# Patient Record
Sex: Female | Born: 1960
Health system: Southern US, Community
[De-identification: ages and names within clinical notes are randomized; demographics above are authoritative.]

## PROBLEM LIST (undated history)

## (undated) DIAGNOSIS — I10 Essential (primary) hypertension: Secondary | ICD-10-CM

## (undated) HISTORY — PX: ABDOMINAL HYSTERECTOMY: SHX81

---

## 1999-02-10 ENCOUNTER — Other Ambulatory Visit: Admission: RE | Admit: 1999-02-10 | Discharge: 1999-02-10 | Payer: Self-pay | Admitting: Gynecology

## 1999-05-22 ENCOUNTER — Other Ambulatory Visit: Admission: RE | Admit: 1999-05-22 | Discharge: 1999-05-22 | Payer: Self-pay | Admitting: Obstetrics & Gynecology

## 1999-12-11 ENCOUNTER — Inpatient Hospital Stay (HOSPITAL_COMMUNITY): Admission: AD | Admit: 1999-12-11 | Discharge: 1999-12-14 | Payer: Self-pay | Admitting: Obstetrics & Gynecology

## 1999-12-15 ENCOUNTER — Encounter: Admission: RE | Admit: 1999-12-15 | Discharge: 2000-01-18 | Payer: Self-pay | Admitting: Obstetrics & Gynecology

## 1999-12-18 ENCOUNTER — Inpatient Hospital Stay (HOSPITAL_COMMUNITY): Admission: AD | Admit: 1999-12-18 | Discharge: 1999-12-18 | Payer: Self-pay | Admitting: *Deleted

## 2000-01-21 ENCOUNTER — Other Ambulatory Visit: Admission: RE | Admit: 2000-01-21 | Discharge: 2000-01-21 | Payer: Self-pay | Admitting: Obstetrics & Gynecology

## 2001-04-18 ENCOUNTER — Other Ambulatory Visit: Admission: RE | Admit: 2001-04-18 | Discharge: 2001-04-18 | Payer: Self-pay | Admitting: Obstetrics & Gynecology

## 2002-12-12 ENCOUNTER — Other Ambulatory Visit: Admission: RE | Admit: 2002-12-12 | Discharge: 2002-12-12 | Payer: Self-pay | Admitting: Obstetrics & Gynecology

## 2005-04-12 ENCOUNTER — Other Ambulatory Visit: Admission: RE | Admit: 2005-04-12 | Discharge: 2005-04-12 | Payer: Self-pay | Admitting: Obstetrics & Gynecology

## 2019-12-19 ENCOUNTER — Emergency Department (HOSPITAL_BASED_OUTPATIENT_CLINIC_OR_DEPARTMENT_OTHER): Payer: 59

## 2019-12-19 ENCOUNTER — Encounter (HOSPITAL_BASED_OUTPATIENT_CLINIC_OR_DEPARTMENT_OTHER): Payer: Self-pay

## 2019-12-19 ENCOUNTER — Emergency Department (HOSPITAL_BASED_OUTPATIENT_CLINIC_OR_DEPARTMENT_OTHER)
Admission: EM | Admit: 2019-12-19 | Discharge: 2019-12-19 | Disposition: A | Discharge status: Home / Self Care | Payer: 59 | Attending: Emergency Medicine | Admitting: Emergency Medicine

## 2019-12-19 ENCOUNTER — Other Ambulatory Visit: Payer: Self-pay

## 2019-12-19 DIAGNOSIS — I10 Essential (primary) hypertension: Secondary | ICD-10-CM | POA: Insufficient documentation

## 2019-12-19 DIAGNOSIS — M25512 Pain in left shoulder: Secondary | ICD-10-CM

## 2019-12-19 HISTORY — DX: Essential (primary) hypertension: I10

## 2019-12-19 MED ORDER — LIDOCAINE 5 % EX PTCH
1.0000 | MEDICATED_PATCH | Freq: Once | CUTANEOUS | Status: DC
  Filled 2019-12-19: qty 1

## 2019-12-19 MED ORDER — INDOMETHACIN 25 MG PO CAPS: 25.0000 mg | ORAL_CAPSULE | Freq: Three times a day (TID) | ORAL | 0 refills | Status: AC | PRN

## 2019-12-19 NOTE — Discharge Instructions (Addendum)
You have been seen today for left shoulder pain. Please read and follow all provided instructions. Return to the emergency room for worsening condition or new concerning symptoms.    1. Medications:  Prescription sent to your pharmacy for Indomethacin. This is an antiinflammatory medication.  Do not take additional ibuprofen, Aleve, Motrin.  You can continue to take Tylenol for pain.  The xray showed : Chronic 2 cm amorphous area of calcification adjacent to the humeral head suspicious for hydroxyapatite deposition disease (HADD). This was seen on xrqy in 2018. Continue usual home medications Take medications as prescribed. Please review all of the medicines and only take them if you do not have an allergy to them.   2. Treatment: rest, drink plenty of fluids.  Do the range of motion exercises. -Not wear the sling for more than 1 hour at a time as we discussed.  3. Follow Up:  Please follow up with orthopedics as we discussed. Call the office of Dr. Jordan Likes tomorrow to schedule next available follow up appointment.   It is also a possibility that you have an allergic reaction to any of the medicines that you have been prescribed - Everybody reacts differently to medications and while MOST people have no trouble with most medicines, you may have a reaction such as nausea, vomiting, rash, swelling, shortness of breath. If this is the case, please stop taking the medicine immediately and contact your physician.  ?

## 2019-12-19 NOTE — ED Notes (Signed)
ED Provider at bedside. 

## 2019-12-19 NOTE — ED Provider Notes (Addendum)
[facility] EMERGENCY DEPARTMENT Provider Note   CSN: [id number] Arrival date & time: 12/19/19  1651     History Chief Complaint  Patient presents with  . Shoulder Pain    Gina Mcdonald is a 59 y.o. female with past medical history significant for hypertension presents to emergency department today with chief complaint of left shoulder pain x2 days.  She states the pain has been constant.  She describes the pain as an aching sensation.  She rates the pain 10 of 10 in severity. She has been taking tylenol and ibuprofen with minimal pain relief.  Pain radiates from her left shoulder down her bicep, pain is worse with movement. She denies any injury or fall.  She states the pain started when she woke up after sleeping in a recliner chair.  She recently had teeth pulled and was sleeping in the recliner chair for comfort. She denies fever, chills, chest pain, shortness of breath, numbness, tingling, weakness. She is not anticoagulated.    Past Medical History:  Diagnosis Date  . Hypertension     There are no problems to display for this patient.   Past Surgical History:  Procedure Laterality Date  . CESAREAN SECTION       OB History   No obstetric history on file.     No family history on file.  Social History   Tobacco Use  . Smoking status: Never Smoker  . Smokeless tobacco: Never Used  Substance Use Topics  . Alcohol use: Yes    Comment: occ  . Drug use: Never    Home Medications Prior to Admission medications   Medication Sig Start Date End Date Taking? Authorizing Provider  indomethacin (INDOCIN) 25 MG capsule Take 1 capsule (25 mg total) by mouth 3 (three) times daily as needed for up to 7 days. 12/19/19 12/26/19  Jarry Manon, Caroleen Hamman, PA-C    Allergies    Patient has no known allergies.  Review of Systems   Review of Systems All other systems are reviewed and are negative for acute change except as noted in the HPI.  Physical Exam Updated  Vital Signs BP (!) 175/106 (BP Location: Left Arm)   Pulse (!) 108   Temp 98.2 F (36.8 C) (Oral)   Resp 16   Ht 5\' 3"  (1.6 m)   Wt 79.6 kg   SpO2 100%   BMI 31.09 kg/m   Physical Exam Vitals and nursing note reviewed.  Constitutional:      Appearance: She is well-developed. She is not ill-appearing or toxic-appearing.  HENT:     Head: Normocephalic and atraumatic.     Nose: Nose normal.  Eyes:     General: No scleral icterus.       Right eye: No discharge.        Left eye: No discharge.     Conjunctiva/sclera: Conjunctivae normal.  Neck:     Vascular: No JVD.  Cardiovascular:     Rate and Rhythm: Normal rate and regular rhythm.     Pulses: Normal pulses.     Heart sounds: Normal heart sounds.  Pulmonary:     Effort: Pulmonary effort is normal.     Breath sounds: Normal breath sounds.  Abdominal:     General: There is no distension.  Musculoskeletal:        General: Normal range of motion.     Cervical back: Normal range of motion.     Comments: Bony tenderness to left shoulder. Decreased ROM  of left shoulder secondary to pain.  No tenderness to left bicep, elbow, wrist and all have full ROM. Neurovascularly intact distally. Compartments soft above and below affected joint. No overlying skin changes. Radial pulse 2+ bilaterally.   Skin:    General: Skin is warm and dry.  Neurological:     Mental Status: She is oriented to person, place, and time.     GCS: GCS eye subscore is 4. GCS verbal subscore is 5. GCS motor subscore is 6.     Comments: Fluent speech, no facial droop.  Psychiatric:        Behavior: Behavior normal.       ED Results / Procedures / Treatments   Labs (all labs ordered are listed, but only abnormal results are displayed) Labs Reviewed - No data to display  EKG None  Radiology DG Shoulder Left  Result Date: 12/19/2019 CLINICAL DATA:  59 year old female who woke with pain and stiffness at the superior left shoulder. Painful range of  motion. EXAM: LEFT SHOULDER - 2+ VIEW COMPARISON:  Chest radiographs 03/29/2017. FINDINGS: No glenohumeral joint dislocation. Proximal left humerus appears intact. The left clavicle and scapula appear intact. Posterior to the left humeral head there is a 2 centimeter area of amorphous calcification, which was partially visible on the 2018 comparison. No acute osseous abnormality identified. Negative visible left ribs and chest. IMPRESSION: 1.  No acute osseous abnormality identified about the left shoulder. 2. Chronic 2 cm amorphous area of calcification adjacent to the humeral head suspicious for hydroxyapatite deposition disease (HADD). Electronically Signed   By: Odessa Fleming M.D.   On: 12/19/2019 17:31    Procedures Procedures (including critical care time)  Medications Ordered in ED Medications  lidocaine (LIDODERM) 5 % 1 patch (has no administration in time range)    ED Course  I have reviewed the triage vital signs and the nursing notes.  Pertinent labs & imaging results that were available during my care of the patient were reviewed by me and considered in my medical decision making (see chart for details).    MDM Rules/Calculators/A&P                      Patient presents to the ED with complaints of pain to the  Left shoulder without injury. Exam without obvious deformity or open wounds. Decreased ROM of left shoulder secondary to pain. Full ROM of left elbow, wrist, hand. Left shoulder with bony tender to palpation . NVI distally. Xray negative for fracture/dislocation. Low suspicion for septic joint given reassuring exam. Will give sling for comfort to wear at work as she works as in a [employer] and uses arms frequently.  Patient aware she should not wear the sling at all times to avoid frozen shoulder.  We went over shoulder exercises do for range of motion.  Lidocaine patch ordered unfortunately we do not have them here.  Discussed with patient that she can buy these  over-the-counter. Will discharge home with prescription for indomethacin for pain and inflammation. Discussed not taking additional NSAIDs. She was noted to be tachycardic to 108 in triage.  I checked patient's heart rate during my exam and prior to discharge and it ranged in the 90s.  She was slightly hypertensive at 175/106, she reports compliance with her hypertension medications.  She does have a follow-up appointment scheduled with her PCP next week.  I recommend she have her blood pressure rechecked at this visit.  I discussed results, treatment  plan, need for orthopedics follow-up, and return precautions with the patient. Provided opportunity for questions, patient confirmed understanding and are in agreement with plan.    Portions of this note were generated with L[employer]. Dictation errors may occur despite best attempts at proofreading.   Final Clinical Impression(s) / ED Diagnoses Final diagnoses:  Acute pain of left shoulder    Rx / DC Orders ED Discharge Orders         Ordered    indomethacin (INDOCIN) 25 MG capsule  3 times daily PRN     12/19/19 1830           Cherre Robins, PA-C 12/19/19 1847    Cherre Robins, PA-C 12/19/19 1852    Wyvonnia Dusky, MD 12/20/19 0157

## 2019-12-19 NOTE — ED Triage Notes (Signed)
Pt states she woke 2 mornings ago with pain/stiffness to left shoulder-pain worse with movement-denies injury-NAD-steady gait

## 2019-12-24 ENCOUNTER — Other Ambulatory Visit: Payer: Self-pay

## 2019-12-24 ENCOUNTER — Ambulatory Visit: Payer: 59 | Admitting: Family Medicine

## 2019-12-24 ENCOUNTER — Ambulatory Visit: Payer: Self-pay

## 2019-12-24 ENCOUNTER — Encounter: Payer: Self-pay | Admitting: Family Medicine

## 2019-12-24 VITALS — BP 142/101 | HR 84 | Ht 63.0 in | Wt 178.0 lb

## 2019-12-24 DIAGNOSIS — M7502 Adhesive capsulitis of left shoulder: Secondary | ICD-10-CM | POA: Diagnosis not present

## 2019-12-24 MED ORDER — TRIAMCINOLONE ACETONIDE 40 MG/ML IJ SUSP
40.0000 mg | Freq: Once | INTRAMUSCULAR | Status: AC
  Administered 2019-12-24: 11:00:00 40 mg via INTRA_ARTICULAR

## 2019-12-24 NOTE — Addendum Note (Signed)
Addended by: Kathi Simpers F on: 12/24/2019 11:17 AM   Modules accepted: Orders

## 2019-12-24 NOTE — Patient Instructions (Signed)
Nice to meet you Please try ice  Please try the exercises   Please send me a message in MyChart with any questions or updates.  Please see me back in 4 weeks.   --Dr. Omid Deardorff  

## 2019-12-24 NOTE — Assessment & Plan Note (Signed)
Clinical exam would suggest that she has a frozen shoulder.  Does not appear that the calcification is contributing. -Injection. -Counseled on home exercise therapy and supportive care. -Could consider Toradol intra-articular injection or physical therapy

## 2019-12-24 NOTE — Progress Notes (Signed)
Gina Mcdonald - 59 y.o. female MRN [id number]  Date of birth: 01/22/1961  SUBJECTIVE:  Including CC & ROS.  Chief Complaint  Patient presents with  . Shoulder Pain    left shoulder x 1 week    Gina Mcdonald is a 59 y.o. female that is presenting with acute left shoulder pain.  She denies any inciting event or trauma.  She has trouble with abduction and flexion.  No history of surgery.  Was seen in the emergency department on 2/17.  Has been taken medicines which helps improve the pain a bit..   Independent review left shoulder x-ray from 12/19/2019 shows a chronic area of calcification adjacent to the humeral head.  Review of Systems See HPI   HISTORY: Past Medical, Surgical, Social, and Family History Reviewed & Updated per EMR.   Pertinent Historical Findings include:  Past Medical History:  Diagnosis Date  . Hypertension     Past Surgical History:  Procedure Laterality Date  . CESAREAN SECTION      No family history on file.  Social History   Socioeconomic History  . Marital status: Single    Spouse name: Not on file  . Number of children: Not on file  . Years of education: Not on file  . Highest education level: Not on file  Occupational History  . Not on file  Tobacco Use  . Smoking status: Never Smoker  . Smokeless tobacco: Never Used  Substance and Sexual Activity  . Alcohol use: Yes    Comment: occ  . Drug use: Never  . Sexual activity: Not on file  Other Topics Concern  . Not on file  Social History Narrative  . Not on file   Social Determinants of Health   Financial Resource Strain:   . Difficulty of Paying Living Expenses: Not on file  Food Insecurity:   . Worried About Programme researcher, broadcasting/film/video in the Last Year: Not on file  . Ran Out of Food in the Last Year: Not on file  Transportation Needs:   . Lack of Transportation (Medical): Not on file  . Lack of Transportation (Non-Medical): Not on file  Physical Activity:   . Days of Exercise per  Week: Not on file  . Minutes of Exercise per Session: Not on file  Stress:   . Feeling of Stress : Not on file  Social Connections:   . Frequency of Communication with Friends and Family: Not on file  . Frequency of Social Gatherings with Friends and Family: Not on file  . Attends Religious Services: Not on file  . Active Member of Clubs or Organizations: Not on file  . Attends Banker Meetings: Not on file  . Marital Status: Not on file  Intimate Partner Violence:   . Fear of Current or Ex-Partner: Not on file  . Emotionally Abused: Not on file  . Physically Abused: Not on file  . Sexually Abused: Not on file     PHYSICAL EXAM:  VS: BP (!) 142/101   Pulse 84   Ht 5\' 3"  (1.6 m)   Wt 178 lb (80.7 kg)   BMI 31.53 kg/m  Physical Exam Gen: NAD, alert, cooperative with exam, well-appearing MSK:  Left shoulder: Limited flexion and abduction actively. Limited passive flexion and abduction. Limited external rotation. Normal strength resistance. Neurovascularly intact  Limited ultrasound: Left shoulder:  Calcification appreciated at the proximal humerus.  There is no hyperemia to this area.  Unclear if it involves  the capsule  Summary: Calcification deposition appreciated.  Ultrasound and interpretation by Clearance Coots, MD   Aspiration/Injection Procedure Note BRIAN KOCOUREK 04/08/1961  Procedure: Injection Indications: Left shoulder pain  Procedure Details Consent: Risks of procedure as well as the alternatives and risks of each were explained to the (patient/caregiver).  Consent for procedure obtained. Time Out: Verified patient identification, verified procedure, site/side was marked, verified correct patient position, special equipment/implants available, medications/allergies/relevent history reviewed, required imaging and test results available.  Performed.  The area was cleaned with iodine and alcohol swabs.    The left glenohumeral joint was  injected using 1 cc's of 40 mg Kenalog, 3 cc of 1% lidocaine, and 3 cc's of 0.25% bupivacaine with a 21 2" needle.  Ultrasound was used. Images were obtained in short views showing the injection.     A sterile dressing was applied.  Patient did tolerate procedure well.    ASSESSMENT & PLAN:   Adhesive capsulitis of left shoulder Clinical exam would suggest that she has a frozen shoulder.  Does not appear that the calcification is contributing. -Injection. -Counseled on home exercise therapy and supportive care. -Could consider Toradol intra-articular injection or physical therapy

## 2020-01-22 ENCOUNTER — Other Ambulatory Visit: Payer: Self-pay

## 2020-01-22 ENCOUNTER — Encounter: Payer: Self-pay | Admitting: Family Medicine

## 2020-01-22 ENCOUNTER — Ambulatory Visit: Payer: 59 | Admitting: Family Medicine

## 2020-01-22 DIAGNOSIS — M7502 Adhesive capsulitis of left shoulder: Secondary | ICD-10-CM

## 2020-01-22 DIAGNOSIS — I10 Essential (primary) hypertension: Secondary | ICD-10-CM

## 2020-01-22 NOTE — Progress Notes (Signed)
  Gina Mcdonald - 59 y.o. female MRN [id number]  Date of birth: 1961-05-08  SUBJECTIVE:  Including CC & ROS.  Chief Complaint  Patient presents with  . Follow-up    follow up for left shoulder    Gina Mcdonald is a 59 y.o. female that is following up for left shoulder pain.  She received a glenohumeral injection upon her last visit.  She feels significant improvement of her pain and range of motion.  She denies any problems lifting or sleeping on the affected side.   Review of Systems See HPI   HISTORY: Past Medical, Surgical, Social, and Family History Reviewed & Updated per EMR.   Pertinent Historical Findings include:  Past Medical History:  Diagnosis Date  . Hypertension     Past Surgical History:  Procedure Laterality Date  . CESAREAN SECTION      No family history on file.  Social History   Socioeconomic History  . Marital status: Single    Spouse name: Not on file  . Number of children: Not on file  . Years of education: Not on file  . Highest education level: Not on file  Occupational History  . Not on file  Tobacco Use  . Smoking status: Never Smoker  . Smokeless tobacco: Never Used  Substance and Sexual Activity  . Alcohol use: Yes    Comment: occ  . Drug use: Never  . Sexual activity: Not on file  Other Topics Concern  . Not on file  Social History Narrative  . Not on file   Social Determinants of Health   Financial Resource Strain:   . Difficulty of Paying Living Expenses:   Food Insecurity:   . Worried About Programme researcher, broadcasting/film/video in the Last Year:   . Barista in the Last Year:   Transportation Needs:   . Freight forwarder (Medical):   Marland Kitchen Lack of Transportation (Non-Medical):   Physical Activity:   . Days of Exercise per Week:   . Minutes of Exercise per Session:   Stress:   . Feeling of Stress :   Social Connections:   . Frequency of Communication with Friends and Family:   . Frequency of Social Gatherings with Friends and  Family:   . Attends Religious Services:   . Active Member of Clubs or Organizations:   . Attends Banker Meetings:   Marland Kitchen Marital Status:   Intimate Partner Violence:   . Fear of Current or Ex-Partner:   . Emotionally Abused:   Marland Kitchen Physically Abused:   . Sexually Abused:      PHYSICAL EXAM:  VS: BP (!) 171/117   Ht 5\' 3"  (1.6 m)   Wt 178 lb (80.7 kg)   BMI 31.53 kg/m  Physical Exam Gen: NAD, alert, cooperative with exam, well-appearing MSK: Left shoulder: Normal active flexion and abduction. Normal internal and external rotation. Normal range of motion. Normal strength resistance. Normal empty can test. Neurovascular intact     ASSESSMENT & PLAN:   Adhesive capsulitis of left shoulder Has had significant improvement with the glenohumeral injection.  May not have full-blown adhesive capsulitis with may have an underlying aspect of degenerative change leading to an effusion of the joint -Counseled on home exercise therapy and supportive care. -Can follow-up as needed.  HTN (hypertension) She has not taken her medications today and has follow up soon with her primary.

## 2020-01-22 NOTE — Assessment & Plan Note (Signed)
She has not taken her medications today and has follow up soon with her primary.

## 2020-01-22 NOTE — Patient Instructions (Signed)
Good to see you  Please try to do the exercises every so often  Please try ice if needed  Please send me a message in MyChart with any questions or updates.  Please see [place] back as needed.   --Dr. Jordan Likes

## 2020-01-22 NOTE — Assessment & Plan Note (Signed)
Has had significant improvement with the glenohumeral injection.  May not have full-blown adhesive capsulitis with may have an underlying aspect of degenerative change leading to an effusion of the joint -Counseled on home exercise therapy and supportive care. -Can follow-up as needed.

## 2020-09-23 IMAGING — DX DG SHOULDER 2+V*L*
4 series · 4 of 4 positions shown · non-contrast
Comparison: Chest radiographs 03/29/2017.

CLINICAL DATA: 58-year-old female who woke with pain and stiffness
at the superior left shoulder. Painful range of motion.

EXAM:
LEFT SHOULDER - 2+ VIEW

[shoulder grashey]
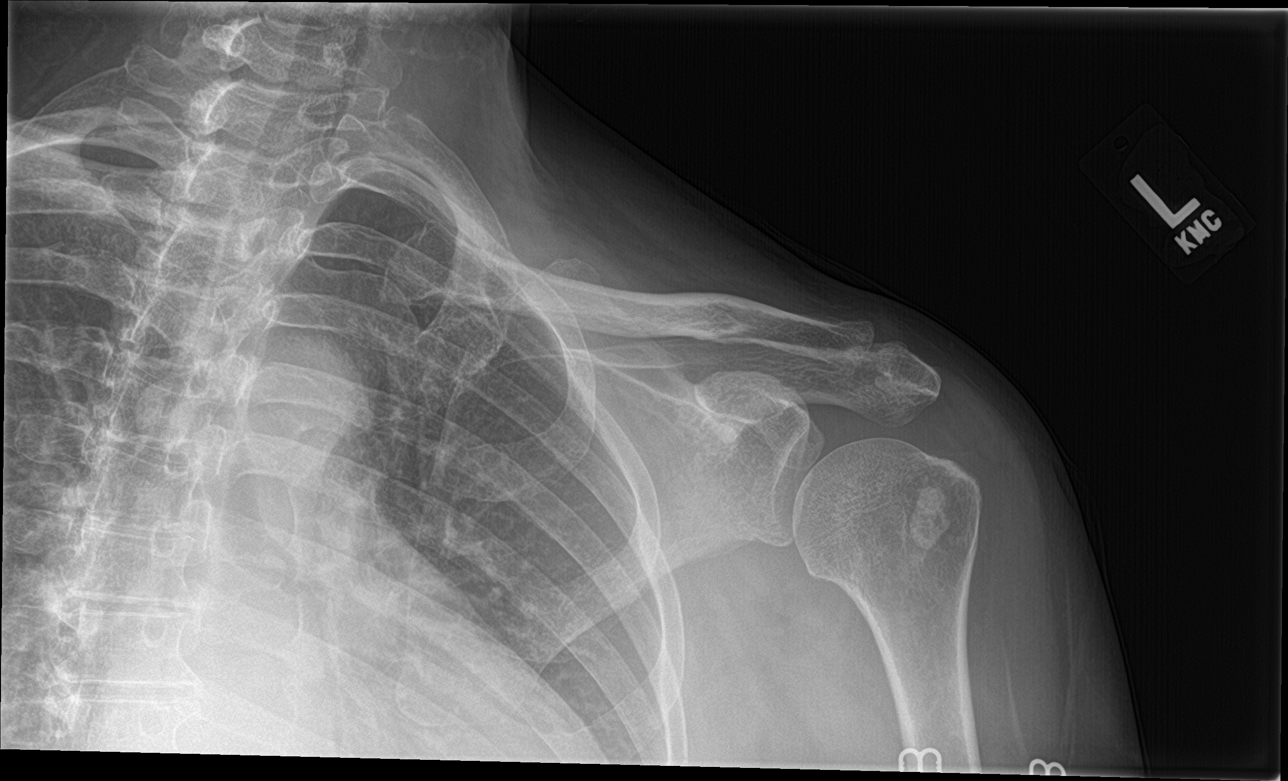

[shoulder ap neutral (1 of 3)]
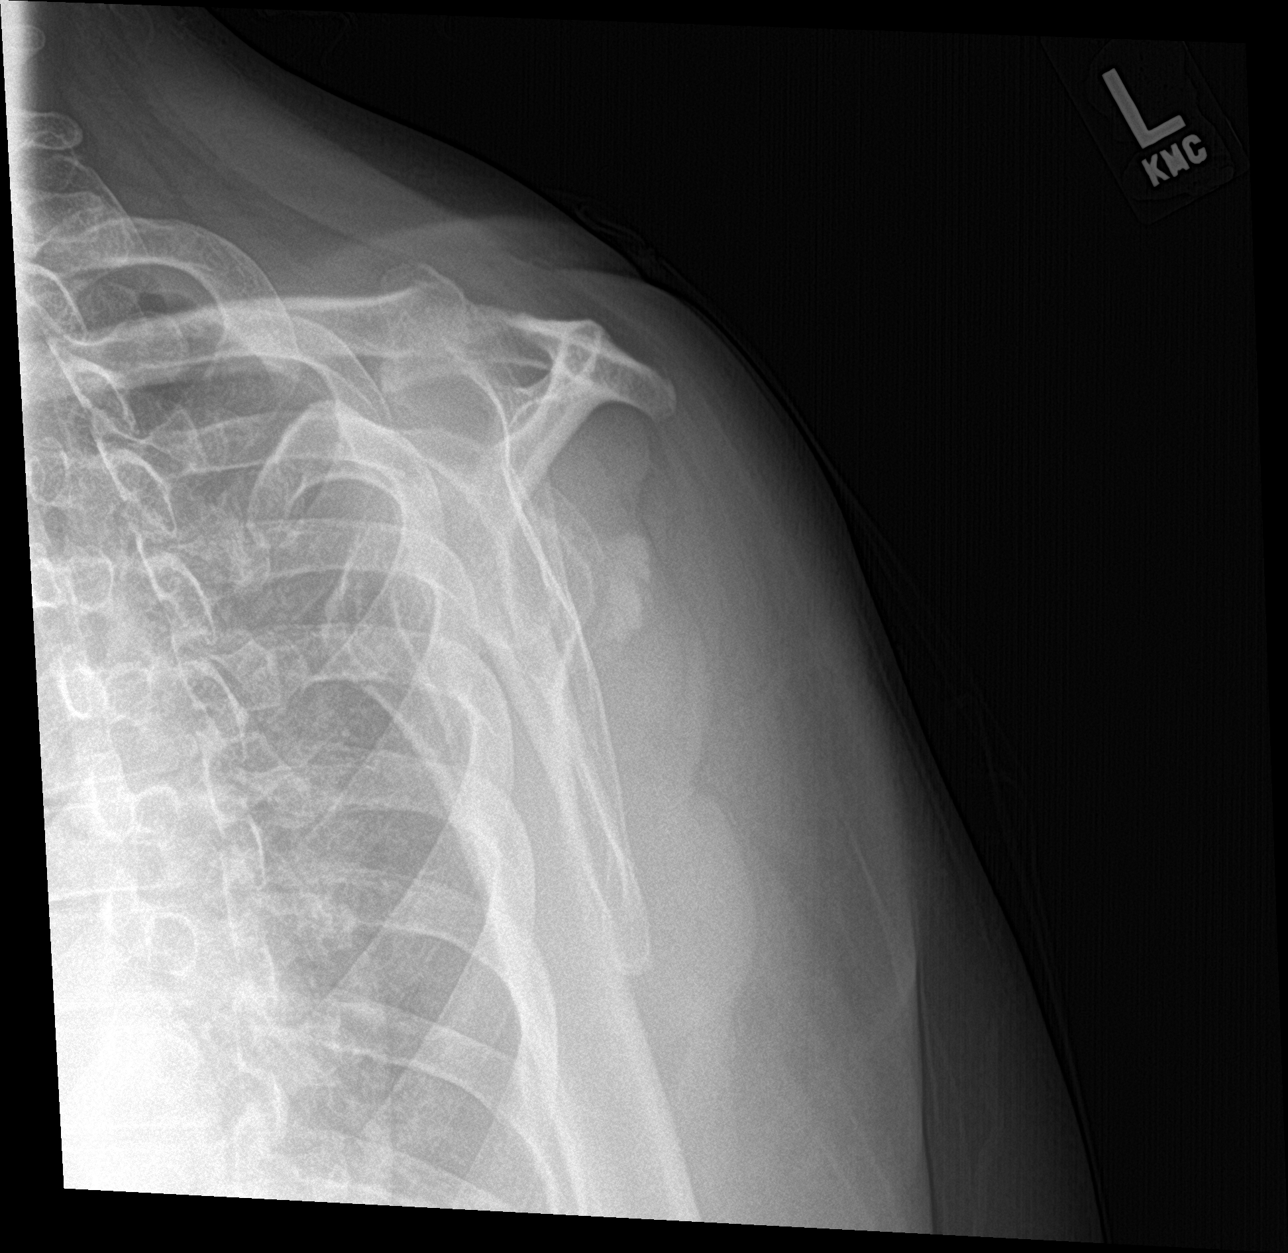

[shoulder ap neutral (2 of 3)]
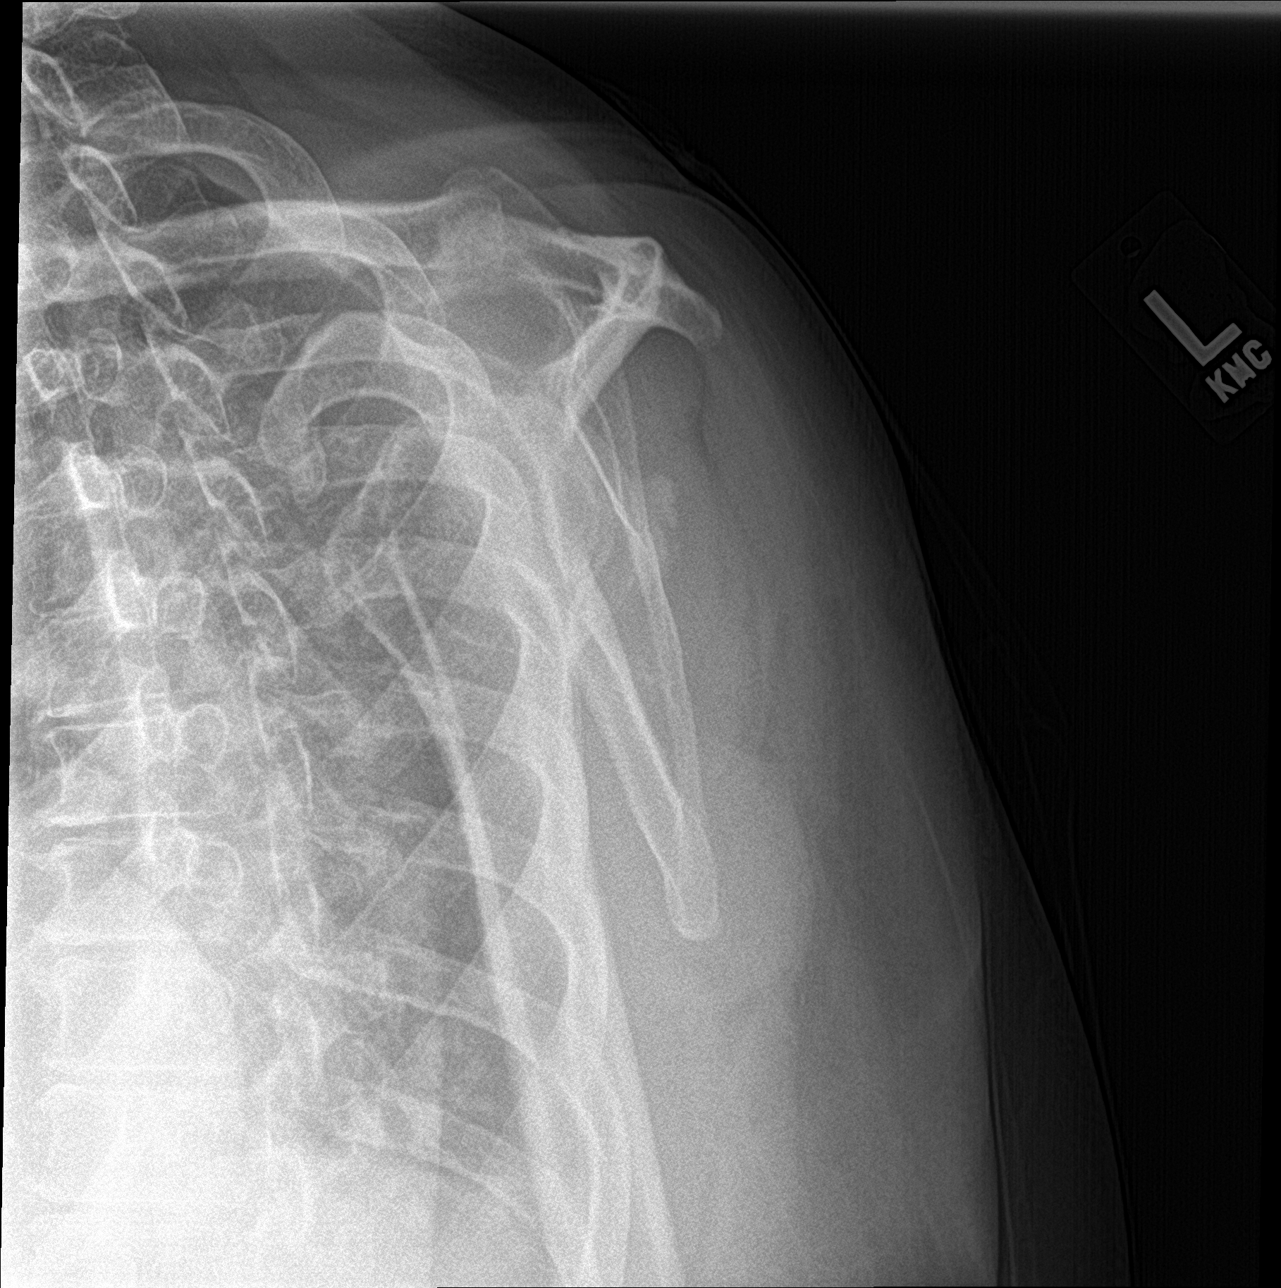

[shoulder ap neutral (3 of 3)]
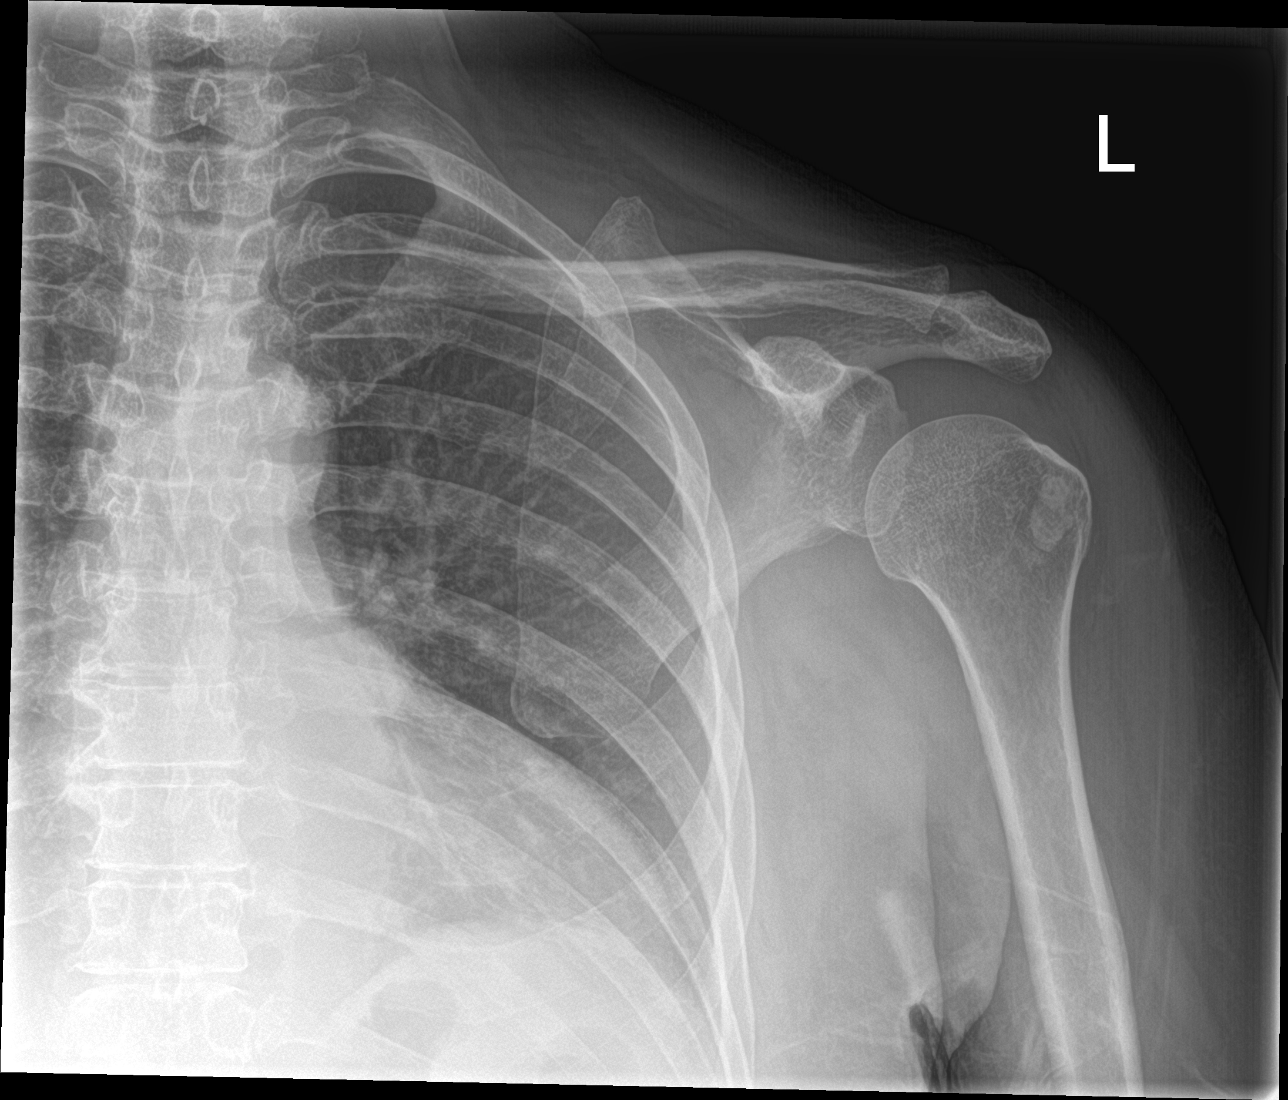

[4 of 4 positions shown; findings below may reference images not displayed]

FINDINGS: No glenohumeral joint dislocation. Proximal left humerus appears
intact. The left clavicle and scapula appear intact.

Posterior to the left humeral head there is a 2 centimeter area of
amorphous calcification, which was partially visible on the 4827
comparison.

No acute osseous abnormality identified. Negative visible left ribs
and chest.
IMPRESSION: 1.  No acute osseous abnormality identified about the left shoulder.
2. Chronic 2 cm amorphous area of calcification adjacent to the
humeral head suspicious for hydroxyapatite deposition disease
([facility]).

## 2020-11-18 ENCOUNTER — Telehealth: Payer: Self-pay | Admitting: Family Medicine

## 2020-11-18 NOTE — Telephone Encounter (Signed)
Pt called left message req'd appt--called pt back for scheduling-no answer, unable to leave message --glh

## 2023-02-15 ENCOUNTER — Encounter: Payer: Self-pay | Admitting: *Deleted

## 2023-06-16 ENCOUNTER — Encounter (HOSPITAL_BASED_OUTPATIENT_CLINIC_OR_DEPARTMENT_OTHER): Payer: Self-pay | Admitting: Emergency Medicine

## 2023-06-16 ENCOUNTER — Emergency Department (HOSPITAL_BASED_OUTPATIENT_CLINIC_OR_DEPARTMENT_OTHER)
Admission: EM | Admit: 2023-06-16 | Discharge: 2023-06-16 | Disposition: A | Discharge status: Home / Self Care | Payer: BC Managed Care – PPO | Attending: Emergency Medicine | Admitting: Emergency Medicine

## 2023-06-16 DIAGNOSIS — J069 Acute upper respiratory infection, unspecified: Secondary | ICD-10-CM | POA: Insufficient documentation

## 2023-06-16 DIAGNOSIS — H9201 Otalgia, right ear: Secondary | ICD-10-CM

## 2023-06-16 DIAGNOSIS — Z79899 Other long term (current) drug therapy: Secondary | ICD-10-CM | POA: Insufficient documentation

## 2023-06-16 DIAGNOSIS — I10 Essential (primary) hypertension: Secondary | ICD-10-CM | POA: Diagnosis not present

## 2023-06-16 MED ORDER — BENZONATATE 100 MG PO CAPS: 100.0000 mg | ORAL_CAPSULE | Freq: Three times a day (TID) | ORAL | 0 refills | Status: AC

## 2023-06-16 MED ORDER — FLUTICASONE PROPIONATE 50 MCG/ACT NA SUSP: 2.0000 | Freq: Every day | NASAL | 2 refills | Status: AC

## 2023-06-16 MED ORDER — METHYLPREDNISOLONE 4 MG PO TBPK: ORAL_TABLET | ORAL | 0 refills | Status: AC

## 2023-06-16 NOTE — ED Notes (Signed)
Cannot discharge, registration in chart.   

## 2023-06-16 NOTE — ED Provider Notes (Signed)
[place] [facility] Provider Note   CSN: [id number] Arrival date & time: 06/16/23  1717     History  Chief Complaint  Patient presents with   Otalgia    Gina Mcdonald is a 62 y.o. female with history of hypertension who presents to the emergency department complaining of right ear pain, dry cough, and runny nose x 4 days.  She has been taking some Tylenol sinus, she felt she had a fever.  She typically loses her voice when she has a cold. Had some increased wheezing, has been using her albuterol inhaler.    Otalgia Associated symptoms: cough and rhinorrhea        Home Medications Prior to Admission medications   Medication Sig Start Date End Date Taking? Authorizing Provider  benzonatate (TESSALON) 100 MG capsule Take 1 capsule (100 mg total) by mouth every 8 (eight) hours. 06/16/23  Yes Marquis Down T, PA-C  fluticasone (FLONASE) 50 MCG/ACT nasal spray Place 2 sprays into both nostrils daily. 06/16/23  Yes Ai Sonnenfeld T, PA-C  methylPREDNISolone (MEDROL DOSEPAK) 4 MG TBPK tablet Take per package instructions 06/16/23  Yes Taylon Coole T, PA-C  acyclovir (ZOVIRAX) 200 MG capsule  10/10/19   [provider]  amLODipine (NORVASC) 10 MG tablet  11/15/19   [provider]  triamterene-hydrochlorothiazide (DYAZIDE) 37.5-25 MG capsule Take by mouth. 04/25/17   [provider]      Allergies    Patient has no known allergies.    Review of Systems   Review of Systems  HENT:  Positive for ear pain and rhinorrhea.   Respiratory:  Positive for cough.   All other systems reviewed and are negative.   Physical Exam Updated Vital Signs BP (!) 154/96 (BP Location: Right Arm)   Pulse (!) 114   Temp 98.5 F (36.9 C) (Oral)   Resp 19   Ht 5\' 3"  (1.6 m)   Wt 79.8 kg   SpO2 96%   BMI 31.18 kg/m  Physical Exam Vitals and nursing note reviewed.  Constitutional:      Appearance: Normal appearance.  HENT:      Head: Normocephalic and atraumatic.     Right Ear: Tympanic membrane, ear canal and external ear normal.     Left Ear: Tympanic membrane, ear canal and external ear normal.     Mouth/Throat:     Lips: Pink.     Mouth: Mucous membranes are moist.     Pharynx: Oropharynx is clear. Uvula midline.     Tonsils: No tonsillar exudate or tonsillar abscesses.  Eyes:     Conjunctiva/sclera: Conjunctivae normal.  Cardiovascular:     Rate and Rhythm: Normal rate and regular rhythm.  Pulmonary:     Effort: Pulmonary effort is normal. No respiratory distress.     Breath sounds: Wheezing present.     Comments: Very mild expiratory wheezing Abdominal:     General: There is no distension.     Palpations: Abdomen is soft.     Tenderness: There is no abdominal tenderness.  Skin:    General: Skin is warm and dry.  Neurological:     General: No focal deficit present.     Mental Status: She is alert.     ED Results / Procedures / Treatments   Labs (all labs ordered are listed, but only abnormal results are displayed) Labs Reviewed - No data to display  EKG None  Radiology No results found.  Procedures Procedures  Medications Ordered in ED Medications - No data to display  ED Course/ Medical Decision Making/ A&P                                 Medical Decision Making Risk Prescription drug management.   This patient is a 62 y.o. female who presents to the ED for concern of ear pain, rhinorrhea.   Differential diagnoses prior to evaluation: The emergent differential diagnosis includes, but is not limited to,  upper respiratory infection, lower respiratory infection, allergies, asthma, irritants, sinus/esophageal foreign body, medications, reflux, interstitial lung disease, postnasal drip, viral illness, sepsis. This is not an exhaustive differential.   Past Medical History / Co-morbidities / Additional history: Chart reviewed. Pertinent results include: HTN  Physical  Exam: Physical exam performed. The pertinent findings include: Mildly tachycardic, otherwise normal vital signs.  In no acute distress.  HEENT exam normal as above.  No mastoid tenderness.  Mild expiratory wheezing, normal respiratory effort.  Normal oxygen saturation on room air.  Disposition: After consideration of the diagnostic results and the patients response to treatment, I feel that emergency department workup does not suggest an emergent condition requiring admission or immediate intervention beyond what has been performed at this time. Patient with symptoms consistent with viral URI.  Vitals are stable. No signs of dehydration, tolerating PO's.   The plan is: Patient will be discharged with instructions to orally hydrate, rest, and use over-the-counter medications such as anti-inflammatories such as ibuprofen and Tylenol for fever.  Will give Medrol Dosepak, Tessalon, and Flonase.  The patient is safe for discharge and has been instructed to return immediately for worsening symptoms, change in symptoms or any other concerns.  Final Clinical Impression(s) / ED Diagnoses Final diagnoses:  Viral URI  Right ear pain    Rx / DC Orders ED Discharge Orders          Ordered    methylPREDNISolone (MEDROL DOSEPAK) 4 MG TBPK tablet        06/16/23 2028    benzonatate (TESSALON) 100 MG capsule  Every 8 hours        06/16/23 2028    fluticasone (FLONASE) 50 MCG/ACT nasal spray  Daily        06/16/23 2028           Portions of this report may have been transcribed using voice recognition software. Every effort was made to ensure accuracy; however, inadvertent computerized transcription errors may be present.    Jeanella Flattery 06/16/23 2033    Vanetta Mulders, MD 06/16/23 2340

## 2023-06-16 NOTE — Discharge Instructions (Signed)
You were seen emergency department today for ear pain and cough.  As we discussed I think you likely have a viral upper respiratory infection.  Your ears did not appear to have a bacterial infection.  I have prescribed you a cough medicine, an intranasal steroid, and some steroids by mouth.  You can continue taking the decongestant at home, and I recommend staying very well-hydrated.  Continue to monitor how you're doing and return to the ER for new or worsening symptoms.

## 2023-06-16 NOTE — ED Notes (Signed)
Discharge paperwork reviewed entirely with patient, including follow up care. Pain was under control. The patient received instruction and coaching on their prescriptions, and all follow-up questions were answered.  Pt verbalized understanding as well as all parties involved. No questions or concerns voiced at the time of discharge. No acute distress noted.   Pt ambulated out to PVA without incident or assistance.  

## 2023-06-16 NOTE — ED Triage Notes (Signed)
Pt c/o RT ear pain since Monday
